# Patient Record
Sex: Female | Born: 1980 | Race: White | Hispanic: No | Marital: Married | Smoking: Current every day smoker
Health system: Southern US, Community
[De-identification: ages and names within clinical notes are randomized; demographics above are authoritative.]

## PROBLEM LIST (undated history)

## (undated) DIAGNOSIS — K635 Polyp of colon: Secondary | ICD-10-CM

## (undated) HISTORY — PX: APPENDECTOMY: SHX54

## (undated) HISTORY — PX: COLPOSCOPY: SHX161

## (undated) HISTORY — PX: VAGINAL HYSTERECTOMY: SUR661

## (undated) HISTORY — DX: Polyp of colon: K63.5

## (undated) HISTORY — PX: CERVICAL BIOPSY  W/ LOOP ELECTRODE EXCISION: SUR135

---

## 1999-05-08 ENCOUNTER — Encounter (INDEPENDENT_AMBULATORY_CARE_PROVIDER_SITE_OTHER): Payer: Self-pay | Admitting: Specialist

## 1999-05-08 ENCOUNTER — Ambulatory Visit (HOSPITAL_COMMUNITY): Admission: RE | Admit: 1999-05-08 | Discharge: 1999-05-08 | Payer: Self-pay | Admitting: Orthopedic Surgery

## 2000-09-08 ENCOUNTER — Other Ambulatory Visit: Admission: RE | Admit: 2000-09-08 | Discharge: 2000-09-08 | Payer: Self-pay | Admitting: Gynecology

## 2000-09-27 ENCOUNTER — Emergency Department (HOSPITAL_COMMUNITY): Admission: EM | Admit: 2000-09-27 | Discharge: 2000-09-27 | Payer: Self-pay | Admitting: Emergency Medicine

## 2000-10-05 ENCOUNTER — Other Ambulatory Visit: Admission: RE | Admit: 2000-10-05 | Discharge: 2000-10-05 | Payer: Self-pay | Admitting: Gynecology

## 2000-10-05 ENCOUNTER — Encounter (INDEPENDENT_AMBULATORY_CARE_PROVIDER_SITE_OTHER): Payer: Self-pay | Admitting: Specialist

## 2000-11-06 ENCOUNTER — Ambulatory Visit (HOSPITAL_COMMUNITY): Admission: RE | Admit: 2000-11-06 | Discharge: 2000-11-06 | Payer: Self-pay | Admitting: Gastroenterology

## 2000-11-06 ENCOUNTER — Encounter (INDEPENDENT_AMBULATORY_CARE_PROVIDER_SITE_OTHER): Payer: Self-pay | Admitting: *Deleted

## 2000-12-08 ENCOUNTER — Inpatient Hospital Stay (HOSPITAL_COMMUNITY): Admission: AD | Admit: 2000-12-08 | Discharge: 2000-12-08 | Payer: Self-pay | Admitting: Gynecology

## 2001-04-21 ENCOUNTER — Inpatient Hospital Stay (HOSPITAL_COMMUNITY): Admission: AD | Admit: 2001-04-21 | Discharge: 2001-04-24 | Payer: Self-pay | Admitting: Gynecology

## 2001-04-21 ENCOUNTER — Encounter (INDEPENDENT_AMBULATORY_CARE_PROVIDER_SITE_OTHER): Payer: Self-pay | Admitting: Specialist

## 2001-06-02 ENCOUNTER — Other Ambulatory Visit: Admission: RE | Admit: 2001-06-02 | Discharge: 2001-06-02 | Payer: Self-pay | Admitting: Urology

## 2002-04-19 ENCOUNTER — Other Ambulatory Visit: Admission: RE | Admit: 2002-04-19 | Discharge: 2002-04-19 | Payer: Self-pay | Admitting: Obstetrics and Gynecology

## 2002-06-10 ENCOUNTER — Ambulatory Visit (HOSPITAL_COMMUNITY): Admission: RE | Admit: 2002-06-10 | Discharge: 2002-06-10 | Payer: Self-pay | Admitting: Obstetrics and Gynecology

## 2002-06-10 ENCOUNTER — Encounter: Payer: Self-pay | Admitting: Obstetrics and Gynecology

## 2002-09-15 ENCOUNTER — Inpatient Hospital Stay (HOSPITAL_COMMUNITY): Admission: AD | Admit: 2002-09-15 | Discharge: 2002-09-15 | Payer: Self-pay | Admitting: Obstetrics and Gynecology

## 2002-09-21 ENCOUNTER — Encounter: Payer: Self-pay | Admitting: Obstetrics and Gynecology

## 2002-09-21 ENCOUNTER — Inpatient Hospital Stay (HOSPITAL_COMMUNITY): Admission: RE | Admit: 2002-09-21 | Discharge: 2002-09-21 | Payer: Self-pay | Admitting: Obstetrics and Gynecology

## 2002-09-26 ENCOUNTER — Encounter: Admission: RE | Admit: 2002-09-26 | Discharge: 2002-09-26 | Payer: Self-pay | Admitting: *Deleted

## 2002-09-26 ENCOUNTER — Encounter (HOSPITAL_COMMUNITY): Admission: RE | Admit: 2002-09-26 | Discharge: 2002-10-26 | Payer: Self-pay | Admitting: Obstetrics and Gynecology

## 2002-09-26 ENCOUNTER — Encounter: Payer: Self-pay | Admitting: Obstetrics and Gynecology

## 2002-09-26 ENCOUNTER — Encounter (INDEPENDENT_AMBULATORY_CARE_PROVIDER_SITE_OTHER): Payer: Self-pay | Admitting: Specialist

## 2002-09-26 ENCOUNTER — Inpatient Hospital Stay (HOSPITAL_COMMUNITY): Admission: AD | Admit: 2002-09-26 | Discharge: 2002-09-27 | Payer: Self-pay | Admitting: Obstetrics and Gynecology

## 2007-05-15 ENCOUNTER — Emergency Department (HOSPITAL_COMMUNITY): Admission: EM | Admit: 2007-05-15 | Discharge: 2007-05-15 | Payer: Self-pay | Admitting: Family Medicine

## 2010-11-15 NOTE — Discharge Summary (Signed)
NAME:  OMNI, DUNSWORTH                           ACCOUNT NO.:  [id number]   MEDICAL RECORD NO.:  [id number]                   PATIENT TYPE:  INP   LOCATION:  9322                                 FACILITY:  [facility]   PHYSICIAN:  Malachi Pro. Ambrose Mantle, M.D.              DATE OF BIRTH:  1980-10-13   DATE OF ADMISSION:  09/26/2002  DATE OF DISCHARGE:  09/27/2002                                 DISCHARGE SUMMARY   HISTORY OF PRESENT ILLNESS:  A 30 year old white married female para 1-0-0-1  gravida 2; EDC Nov 06, 2002; admitted with intrauterine fetal demise for  induction of labor.  Blood group and type O positive, negative antibody,  nonreactive serology, rubella immune.  Hepatitis B surface antigen negative,  HIV negative, GC and chlamydia negative, triple screen declined.  One-hour  Glucola 77.  Vaginal ultrasound on April 04, 2002:  Crown-rump length 2.44  cm, nine weeks one day, [facility] Nov 06, 2002.  Colposcopy on May 11, 2002  showed acetowhite area at 7 o'clock with punctation.  Follow-up ultrasound  June 11, 2002:  Average gestational age [redacted] weeks 0 days, [facility] Nov 04, 2002.  On approximately September 16, 2002 the patient came to maternity  admissions for possible rupture of membranes.  Tests were negative.  On  September 19, 2002 fundal height was 30 cm at 33 weeks.  Nonstress test was  reactive.  Sonogram showed symmetrical growth restriction, AFI was 9 cm,  Dopplers were normal.  The patient continued to feel fetal movement through  the night prior to admission.  On the day of admission at the scheduled  nonstress test there was no fetal heart rate.  Ultrasound showed a breech  presentation, no fetal heart rate.  The baby was vertex on September 21, 2002  and was breech on the day of admission.   PAST MEDICAL HISTORY:  1. Possible reaction to PENICILLIN.  2. Illnesses:  None except colon polyps in 2002 and urinary tract     infections.  3. Operations:  Cyst removed from her ankle in 2000,  cyst from her face in     1990.  4. Alcohol, tobacco, and drugs:  None.   FAMILY HISTORY:  Brother had high blood pressure.   OBSTETRICAL HISTORY:  In October 2002 she delivered a 5 pound 15 ounce  female.   HOSPITAL COURSE:  On admission her vital signs were normal.  The abdomen was  soft, fundal height 31 cm.  Ultrasound confirmed fetal demise.  Cervix was 2  cm, 30%, breech presentation.  The patient was begun on intravenous Pitocin.  By 6:18 p.m. the contractions were every two minutes.  The cervix was a  loose fingertip, 50%, bag of waters was bulging, presenting part was not  palpable.  By 9:25 p.m. the contractions were every two to three minutes,  cervix was 4 cm, 80-90%, the breech was at  a -2.  Artificial rupture of  membranes produced meconium-stained fluid.  Pitocin was at 30 milliunits a  minute.  The patient progressed rapidly and requested an epidural but she  did not have time to get it.  The baby presented as a double-footling breech  and she delivered spontaneously over an intact perineum.  The placenta  delivered and small fragments were left behind.  They were removed with ring  forceps.  The infant was female and looked normal physiologically.  Blood loss  was about 50-100 mL.  Postpartum the patient did well and wanted discharge  almost immediately after delivery, but she was kept for approximately eight  hours for observation.  Hemoglobin on admission 12.7; hematocrit 36.6; white  count 15,200; platelet count 217,000.  Follow-up hemoglobin 11.2; platelet  count 163,000; RPR nonreactive.   FINAL DIAGNOSIS:  Intrauterine pregnancy at 34 weeks with intrauterine fetal  demise delivered as a double-footling breech.   OPERATIONS:  Assisted delivery of a double-footling breech.   FINAL CONDITION:  Improved.   DISCHARGE INSTRUCTIONS:  1. No vaginal entrance.  2. Call with any fever above 100.4 degrees.  3. Call with any heavy vaginal bleeding.  4. Report any signs  of postpartum depression.  5. Return to the office in six weeks.   NOTE:  The couple has decided to have autopsy on the baby.  Initially, the  father-of-the-baby's reaction was he did not want his baby cut on, but they  did accept doing an autopsy in hopes of finding a cause for the demise.   DISPOSITION:  She is to return in six weeks, and a prescription for Percocet  5/325 #16 tablets one q.4-6h. is given at discharge.                                               Malachi Pro. Ambrose Mantle, M.D.    TFH/MEDQ  D:  09/27/2002  T:  09/27/2002  Job:  [id number]

## 2010-11-15 NOTE — H&P (Signed)
[facility]  Patient:    Diane Avery, Diane Avery Visit Number: [id number] MRN: [id number]          Service Type: OBS Location: 910B 9159 01 Attending Physician:  Tonye Royalty Dictated by:   Gaetano Hawthorne. Lily Peer, M.D. Admit Date:  04/21/2001                           History and Physical  CHIEF COMPLAINT:              Labor.  HISTORY:                      Patient is a 30 year old gravida 1, para 0 with last menstrual period on July 16, 2000, estimated date of confinement October 24th, currently [redacted] weeks gestation.  Patient presented to the [facility] early this morning complaining of contractions starting earlier in the day and intensifying in frequency.  She was found to be contracting every three to four minutes apart in Maternity Admissions with a reactive fetal heart rate tracing and her cervix was approximately 3-cm dilated, 80% effaced, -3 station with bulging membrane.  Patient had stable vital signs.  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  She and her husband were both tested for cystic fibrosis, which were negative.  She also had mucusy blood from her stool and had an evaluation by the gastroenterologist who did a sigmoidoscopy and removed a hyperplastic rectal polyp.  Also, she had an amniocentesis during the second trimester secondary to elevated alpha-fetoprotein and a questionable echogenic area noted in her bowel and amniocentesis was reported to be normal.  ALLERGIES:                    Patient is allergic to PENICILLIN.  PAST MEDICAL HISTORY:         She has a history of HPV, history of low-grade squamous intraepithelial lesion.  She had cysts removed from her face and ankle in 1989 and 2000,  respectively.  REVIEW OF SYSTEMS:            See [place] form.  PHYSICAL EXAMINATION:  GENERAL:                      Well-developed, well-nourished female.  HEENT:                        Unremarkable.  NECK:                         Supple.  Trachea midline.  No carotid bruit or thyromegaly.  LUNGS:                        Clear to auscultation without rhonchi or wheezes.  HEART:                        Regular rate and rhythm.  No murmurs or gallops.  BREASTS:                      Exam not done.  ABDOMEN:  Gravid uterus, vertex presentation by [employer].  PELVIC:                       Cervix is now 5 cm, 90%, -1 station, bulging membranes.  EXTREMITIES:                  DTRs 1+.  Negative clonus.  PRENATAL LABORATORY DATA:     O-positive blood type, negative antibody screen. VDRL was nonreactive.  Rubella immune.  Hepatitis B surface antigen and HIV were negative.  Pap smear was CIN-1.  GC and Chlamydia cultures were negative. Alpha-fetoprotein as described above and group B strep positive.  ASSESSMENT:                   [age]-year-old gravida 1, para 0, in labor, advanced cervical dilatation, positive group B streptococcus culture.  Patient was started on clindamycin for group beta streptococcus prophylaxis due to the fact that patient is allergic to penicillin.  Awaiting at the present time for an epidural and will proceed then with rupture of membranes and full internal monitorization.  Patients contractions have subsided and as soon as the epidural is on board, will begin with Pitocin augmentation.  The fetal heart rate tracing otherwise has been reassuring and her vital signs are stable, as per assessment above. Dictated by:   Gaetano Hawthorne Lily Peer, M.D. Attending Physician:  Tonye Royalty DD:  04/22/01 TD:  04/22/01 Job: [id number]

## 2010-11-15 NOTE — Procedures (Signed)
[place]  Patient:    Diane Avery, Diane Avery                        MRN: [id number] Proc. Date: 11/06/00 Adm. Date:  [id number] Disc. Date: [id number] Attending:  Charna Elizabeth CC:         Gaetano Hawthorne. Lily Peer, M.D.   Procedure Report  DATE OF BIRTH:  Feb 20, 1981.  PROCEDURE:  Flexible sigmoidoscopy with biopsies.  ENDOSCOPIST:  Anselmo Rod, M.D.  INSTRUMENT USED:  Pediatric Olympus colonoscope.  INDICATION FOR PROCEDURE:  Rectal bleeding and soft stool in a 30 year old white female who is [redacted] weeks pregnant.  Rule out distal proctitis versus left-sided colitis.  PREPROCEDURE PREPARATION:  Informed consent was procured from the patient. The patient was fasted for eight hours prior to the procedure and prepped with two Fleets enemas the morning of the procedure.  PREPROCEDURE PHYSICAL:  VITAL SIGNS:  The patient had stable vital signs.  NECK:  Supple.  CHEST:  Clear to auscultation.  S1, S2 regular.  ABDOMEN:  Soft with a gravid uterus.  No hepatosplenomegaly.  No masses palpable.  DESCRIPTION OF PROCEDURE:  The patient was placed in the left lateral decubitus position, and no sedation was used.  Once the patient was adequately positioned, the Olympus video pediatric colonoscope was advanced from the rectum to 80 cm without difficulty.  The patient had an excellent prep. Except for small internal hemorrhoids seen on retroflexion and a small rectal polyp, no other abnormalities were seen.  There was no evidence of colitis. The entire colonic mucosa appeared healthy up to 80 cm, with no evidence of erosions, ulcerations, or masses.  A small rectal polyp was seen in the rectum.  This was biopsied via cold biopsy forceps.  On retroflexion, there were small internal hemorrhoids appreciated.  There were whitish-appearing plaques over the hemorrhoids of unclear significance.  IMPRESSION: 1. Healthy-appearing colonic mucosa in the left colon  and rectum except for    small rectal polyp, biopsied for pathology. 2. Small internal hemorrhoids.  RECOMMENDATIONS: 1. Considering the fact the patient has had slight fever today, urinalysis,    CBC, with CMET will be done. 2. The patient has been advised to increase the fluid and fiber in her diet    and to hold off on any use of steroid suppositories because of her    pregnancy. 3. Fiber supplements have been discussed with her in great detail, and    brochures have been given for her education. 4. Follow-up is advised in the office within the next week.  She is to see    Dr. Lily Peer in his office today for further workup of her low-grade    fever. DD:  11/06/00 TD:  11/09/00 Job: [id number]

## 2010-11-15 NOTE — Discharge Summary (Signed)
[facility]  Patient:    Diane Avery, Diane Avery Visit Number: [id number] MRN: [id number]          Service Type: OBS Location: 910A 9118 01 Attending Physician:  Tonye Royalty Dictated by:   Antony Contras, [facility] Admit Date:  04/21/2001 Discharge Date: 04/24/2001                             Discharge Summary  DISCHARGE DIAGNOSES:          1. Intrauterine pregnancy at term.                               2. Spontaneous onset of labor.                               3. Recurrent deep variable decelerations.  PROCEDURES:                   Low forceps-assisted vaginal delivery, with delivery of viable infant over first-degree episiotomy.  Occult cord around infants leg with scant ______ .  HISTORY OF PRESENT ILLNESS:   The patient is a 30 year old primigravida with LMP of July 16, 2000, [facility] April 22, 2001.  Prenatal risk factors include history of HPV.  PRENATAL LABORATORY DATA:     Blood type 0 positive, antibody screen negative. RPR, HBsAg, HIV nonreactive.  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  She also had low-grade squamous intraepithelial lesions confirmed by colposcopy.  Also, she did have an amniocentesis secondary to elevated AFP and a questionable echogenic area on her valve, and amniocentesis was reported to be normal.  HOSPITAL COURSE:              The patient was admitted on April 22, 2001, with spontaneous onset of labor.  Cervix 5 cm, 90%, -1 station, with membranes bulging.  Labor progressed to complete dilatation.  The patient did experience deep variable decelerations.  Delivery was accomplished by Dr. Farrel Gobble per low forceps-assisted delivery.  The patient was delivered of an Apgars of 9 and 9, 5-pound 15-ounce female over a midline episiotomy, without lacerations. Occult cord was noted to be around the infants leg with ______.  Postpartum course:  The patient remained afebrile and was  able to be discharged in satisfactory condition on her second postoperative day.  CBC:  Hematocrit 29, hemoglobin 10.1, WBC 15.6, platelets 188.  FOLLOW-UP:                    In six weeks.  MEDICATIONS:                  Continue with prenatal vitamins and iron. Motrin or Tylox for pain. Dictated by:   Antony Contras, [facility] Attending Physician:  Tonye Royalty DD:  05/07/01 TD:  05/09/01 Job: [id number]

## 2017-12-29 ENCOUNTER — Ambulatory Visit: Payer: Managed Care, Other (non HMO) | Admitting: Gynecology

## 2017-12-29 ENCOUNTER — Encounter: Payer: Self-pay | Admitting: Gynecology

## 2017-12-29 VITALS — BP 116/76 | Ht 63.0 in | Wt 158.0 lb

## 2017-12-29 DIAGNOSIS — Z13220 Encounter for screening for lipoid disorders: Secondary | ICD-10-CM | POA: Diagnosis not present

## 2017-12-29 DIAGNOSIS — Z01419 Encounter for gynecological examination (general) (routine) without abnormal findings: Secondary | ICD-10-CM | POA: Diagnosis not present

## 2017-12-29 DIAGNOSIS — R102 Pelvic and perineal pain: Secondary | ICD-10-CM | POA: Diagnosis not present

## 2017-12-29 DIAGNOSIS — N951 Menopausal and female climacteric states: Secondary | ICD-10-CM | POA: Diagnosis not present

## 2017-12-29 NOTE — Progress Notes (Signed)
    Diane BraceMary S Avery 03-Jan-1981 [id number]        37 y.o.  G4P4 for annual gynecologic exam.  Has not been in the office for a number of years.  Most recently over the past 4 to 6 weeks has noticed both left and right lower quadrant/pelvic bloating and cramping pain.  Some nausea on and off.  No urinary symptoms such as frequency dysuria urgency low back pain fever or chills.  No constipation diarrhea.  Has had a colonoscopy last year.  Status post vaginal hysterectomy 2011 due to persistent abnormal Pap smears although review of her final pathology showed no residual dysplasia in the hysterectomy specimen.  No abnormal Pap smears since then.  Also notes some episodes of hot flushes in the middle of the night that has woken her up times several episodes.  No significant weight gain or loss.  No hair or skin changes.  Past medical history,surgical history, problem list, medications, allergies, family history and social history were all reviewed and documented as reviewed in the EPIC chart.  ROS:  Performed with pertinent positives and negatives included in the history, assessment and plan.   Additional significant findings : None   Exam: Kennon PortelaKim Gardner assistant Vitals:   12/29/17 1123  BP: 116/76  Weight: 158 lb (71.7 kg)  Height: 5\' 3"  (1.6 m)   Body mass index is 27.99 kg/m.  General appearance:  Normal affect, orientation and appearance. Skin: Grossly normal HEENT: Without gross lesions.  No cervical or supraclavicular adenopathy. Thyroid normal.  Lungs:  Clear without wheezing, rales or rhonchi Cardiac: RR, without RMG Abdominal:  Soft, nontender, without masses, guarding, rebound, organomegaly or hernia Breasts:  Examined lying and sitting without masses, retractions, discharge or axillary adenopathy. Pelvic:  Ext, BUS, Vagina: Normal.  Pap smear of vaginal cuff done  Adnexa: Without masses or tenderness    Anus and perineum: Normal   Rectovaginal: Normal sphincter tone without  palpated masses or tenderness.    Assessment/Plan:  [name] for annual gynecologic exam.   1. Lower pelvic discomfort over the past 4 to 6 weeks.  No localizing symptoms.  Status post vaginal hysterectomy in the past.  Also with menopausal symptoms of hot flushes at night.  No other symptoms such as weight gain loss hair or skin.  Has had a colonoscopy last year with colon polyps noted.  Will start with ultrasound for ovarian surveillance.  Urine analysis rule out bladder etiology.  Also [facility] due to menopausal symptoms.  Patient will follow-up for the ultrasound and then will go from there. 2. Breast health.  Breast exam normal today.  No strong family history of breast cancer.  Plan baseline mammogram at age 37. 3. History of abnormal Pap smears.  Had LEEP and ultimately hysterectomy in the past.  Final pathology from hysterectomy reviewed and showed no residual cervical dysplasia.  Last Pap smear reported 2014.  Pap smear done today.  Recommended continued vaginal surveillance for now with ongoing Pap smears at 3-year intervals. 4. Health maintenance.  Baseline CBC, comprehensive metabolic panel and lipid profile ordered with above hormonal blood work.  Follow-up for ultrasound as scheduled.   Dara Lordsimothy P Ayssa Bentivegna MD, 12:03 PM 12/29/2017

## 2017-12-29 NOTE — Patient Instructions (Signed)
Follow up for ultrasound as scheduled 

## 2017-12-29 NOTE — Addendum Note (Signed)
Addended by: Dayna BarkerGARDNER, KIMBERLY K on: 12/29/2017 12:17 PM   Modules accepted: Orders

## 2017-12-30 ENCOUNTER — Other Ambulatory Visit: Payer: Self-pay | Admitting: Gynecology

## 2017-12-30 DIAGNOSIS — E7800 Pure hypercholesterolemia, unspecified: Secondary | ICD-10-CM

## 2017-12-30 LAB — COMPREHENSIVE METABOLIC PANEL
AG Ratio: 2 (calc) (ref 1.0–2.5)
ALBUMIN MSPROF: 4.6 g/dL (ref 3.6–5.1)
ALKALINE PHOSPHATASE (APISO): 56 U/L (ref 33–115)
ALT: 11 U/L (ref 6–29)
AST: 15 U/L (ref 10–30)
BUN: 9 mg/dL (ref 7–25)
CHLORIDE: 103 mmol/L (ref 98–110)
CO2: 24 mmol/L (ref 20–32)
CREATININE: 0.76 mg/dL (ref 0.50–1.10)
Calcium: 9.7 mg/dL (ref 8.6–10.2)
GLOBULIN: 2.3 g/dL (ref 1.9–3.7)
GLUCOSE: 93 mg/dL (ref 65–99)
Potassium: 4 mmol/L (ref 3.5–5.3)
Sodium: 139 mmol/L (ref 135–146)
Total Bilirubin: 0.4 mg/dL (ref 0.2–1.2)
Total Protein: 6.9 g/dL (ref 6.1–8.1)

## 2017-12-30 LAB — CBC WITH DIFFERENTIAL/PLATELET
Basophils Absolute: 74 cells/uL (ref 0–200)
Basophils Relative: 0.7 %
EOS PCT: 1.1 %
Eosinophils Absolute: 116 cells/uL (ref 15–500)
HCT: 42.5 % (ref 35.0–45.0)
Hemoglobin: 14.6 g/dL (ref 11.7–15.5)
Lymphs Abs: 3423 cells/uL (ref 850–3900)
MCH: 32.3 pg (ref 27.0–33.0)
MCHC: 34.4 g/dL (ref 32.0–36.0)
MCV: 94 fL (ref 80.0–100.0)
MONOS PCT: 4.8 %
MPV: 10.8 fL (ref 7.5–12.5)
Neutro Abs: 6384 cells/uL (ref 1500–7800)
Neutrophils Relative %: 60.8 %
PLATELETS: 232 10*3/uL (ref 140–400)
RBC: 4.52 10*6/uL (ref 3.80–5.10)
RDW: 12.6 % (ref 11.0–15.0)
TOTAL LYMPHOCYTE: 32.6 %
WBC mixed population: 504 cells/uL (ref 200–950)
WBC: 10.5 10*3/uL (ref 3.8–10.8)

## 2017-12-30 LAB — FOLLICLE STIMULATING HORMONE: FSH: 4.7 m[IU]/mL

## 2017-12-30 LAB — THYROID PANEL WITH TSH
FREE THYROXINE INDEX: 2.7 (ref 1.4–3.8)
T3 Uptake: 28 % (ref 22–35)
T4 TOTAL: 9.7 ug/dL (ref 5.1–11.9)
TSH: 2.92 mIU/L

## 2017-12-30 LAB — URINALYSIS, COMPLETE W/RFL CULTURE
BILIRUBIN URINE: NEGATIVE
Bacteria, UA: NONE SEEN /HPF
Glucose, UA: NEGATIVE
Hgb urine dipstick: NEGATIVE
Hyaline Cast: NONE SEEN /LPF
Ketones, ur: NEGATIVE
Leukocyte Esterase: NEGATIVE
NITRITES URINE, INITIAL: NEGATIVE
PROTEIN: NEGATIVE
RBC / HPF: NONE SEEN /HPF (ref 0–2)
SQUAMOUS EPITHELIAL / LPF: NONE SEEN /HPF (ref <=5)
Specific Gravity, Urine: 1.004 (ref 1.001–1.03)
WBC, UA: NONE SEEN /HPF (ref 0–5)
pH: 7 (ref 5.0–8.0)

## 2017-12-30 LAB — LIPID PANEL
CHOL/HDL RATIO: 3.7 (calc) (ref <5.0)
CHOLESTEROL: 232 mg/dL — AB (ref <200)
HDL: 63 mg/dL (ref >50)
LDL CHOLESTEROL (CALC): 141 mg/dL — AB
NON-HDL CHOLESTEROL (CALC): 169 mg/dL — AB (ref <130)
Triglycerides: 151 mg/dL — ABNORMAL HIGH (ref <150)

## 2017-12-30 LAB — PAP IG W/ RFLX HPV ASCU

## 2017-12-30 LAB — NO CULTURE INDICATED

## 2018-01-11 ENCOUNTER — Ambulatory Visit: Payer: Managed Care, Other (non HMO) | Admitting: Gynecology

## 2018-01-11 ENCOUNTER — Ambulatory Visit (INDEPENDENT_AMBULATORY_CARE_PROVIDER_SITE_OTHER): Payer: Managed Care, Other (non HMO)

## 2018-01-11 ENCOUNTER — Encounter: Payer: Self-pay | Admitting: Gynecology

## 2018-01-11 VITALS — BP 116/74

## 2018-01-11 DIAGNOSIS — R102 Pelvic and perineal pain: Secondary | ICD-10-CM

## 2018-01-11 NOTE — Patient Instructions (Signed)
Try the over-the-counter probiotics to see if this does not help with your intestinal discomfort.  Follow-up with your gastroenterologist if the pain continues.  Follow-up in 1 year for annual exam

## 2018-01-11 NOTE — Progress Notes (Signed)
    Diane Avery 1980/09/10 [id number]        37 y.o.  G4P4 presents for ultrasound.  Patient has a history of lower abdominal bloating and cramping.  Some nausea on and off.  Ultrasound was ordered to rule out ovarian pathology.  Patient is status post hysterectomy in the past for dysplasia.  Past medical history,surgical history, problem list, medications, allergies, family history and social history were all reviewed and documented in the EPIC chart.  Directed ROS with pertinent positives and negatives documented in the history of present illness/assessment and plan.  Exam: Vitals:   01/11/18 1053  BP: 116/74   General appearance:  Normal  Ultrasound transvaginal status post hysterectomy noting vaginal cuff is normal.  Right and left ovaries are normal with physiologic follicular changes.  Cul-de-sac negative  Assessment/Plan:  37 y.o. G4P4 normal ultrasound status post hysterectomy.  We discussed that her pain is most likely GI.  I recommended a trial of probiotics to see if this would not help.  If her discomfort would continue I recommended she follow-up with her gastroenterologist and she agrees to do so.    Dara Lordsimothy P Martie Muhlbauer MD, 11:05 AM 01/11/2018

## 2018-01-25 ENCOUNTER — Ambulatory Visit: Payer: Self-pay | Admitting: Gynecology

## 2019-03-03 ENCOUNTER — Encounter: Payer: Self-pay | Admitting: Gynecology

## 2019-03-03 ENCOUNTER — Ambulatory Visit (INDEPENDENT_AMBULATORY_CARE_PROVIDER_SITE_OTHER): Payer: Managed Care, Other (non HMO) | Admitting: Gynecology

## 2019-03-03 ENCOUNTER — Other Ambulatory Visit: Payer: Self-pay

## 2019-03-03 VITALS — BP 118/76 | Ht 63.0 in | Wt 131.0 lb

## 2019-03-03 DIAGNOSIS — Z13220 Encounter for screening for lipoid disorders: Secondary | ICD-10-CM | POA: Diagnosis not present

## 2019-03-03 DIAGNOSIS — Z01419 Encounter for gynecological examination (general) (routine) without abnormal findings: Secondary | ICD-10-CM | POA: Diagnosis not present

## 2019-03-03 NOTE — Progress Notes (Signed)
    PRIYA MATSEN 1980/12/03 [id number]        38 y.o.  G4P4 for annual gynecologic exam.  Without gynecologic complaints  Past medical history,surgical history, problem list, medications, allergies, family history and social history were all reviewed and documented as reviewed in the EPIC chart.  ROS:  Performed with pertinent positives and negatives included in the history, assessment and plan.   Additional significant findings : None   Exam: Caryn Bee assistant Vitals:   03/03/19 0800  BP: 118/76  Weight: 131 lb (59.4 kg)  Height: 5\' 3"  (1.6 m)   Body mass index is 23.21 kg/m.  General appearance:  Normal affect, orientation and appearance. Skin: Grossly normal HEENT: Without gross lesions.  No cervical or supraclavicular adenopathy. Thyroid normal.  Lungs:  Clear without wheezing, rales or rhonchi Cardiac: RR, without RMG Abdominal:  Soft, nontender, without masses, guarding, rebound, organomegaly or hernia Breasts:  Examined lying and sitting without masses, retractions, discharge or axillary adenopathy. Pelvic:  Ext, BUS, Vagina: Normal  Adnexa: Without masses or tenderness    Anus and perineum: Normal   Rectovaginal: Normal sphincter tone without palpated masses or tenderness.    Assessment/Plan:  38 y.o. G39P4 female for annual gynecologic exam.  Status post TVH   1. Breast health.  Breast exam normal today.  Plan baseline mammography next year at age 28.  SBE monthly reviewed. 2. Pap smear 2019.  No Pap smear done today.  History of LEEP with ultimate hysterectomy.  Final pathology from the hysterectomy showed no dysplasia.  Pap smears have been normal since.  Plan continued Pap smear surveillance every 3 years. 3. Health maintenance.  CBC, CMP and lipid profile ordered.  Follow-up 1 year, sooner as needed.   Anastasio Auerbach MD, 8:25 AM 03/03/2019

## 2019-03-03 NOTE — Patient Instructions (Signed)
Follow-up in 1 year for annual exam, sooner if any issues. 

## 2019-03-04 LAB — COMPREHENSIVE METABOLIC PANEL
AG Ratio: 2.3 (calc) (ref 1.0–2.5)
ALT: 11 U/L (ref 6–29)
AST: 19 U/L (ref 10–30)
Albumin: 4.2 g/dL (ref 3.6–5.1)
Alkaline phosphatase (APISO): 40 U/L (ref 31–125)
BUN: 9 mg/dL (ref 7–25)
CO2: 29 mmol/L (ref 20–32)
Calcium: 9.6 mg/dL (ref 8.6–10.2)
Chloride: 103 mmol/L (ref 98–110)
Creat: 0.86 mg/dL (ref 0.50–1.10)
Globulin: 1.8 g/dL (calc) — ABNORMAL LOW (ref 1.9–3.7)
Glucose, Bld: 84 mg/dL (ref 65–99)
Potassium: 3.8 mmol/L (ref 3.5–5.3)
Sodium: 139 mmol/L (ref 135–146)
Total Bilirubin: 0.3 mg/dL (ref 0.2–1.2)
Total Protein: 6 g/dL — ABNORMAL LOW (ref 6.1–8.1)

## 2019-03-04 LAB — CBC WITH DIFFERENTIAL/PLATELET
Absolute Monocytes: 570 cells/uL (ref 200–950)
Basophils Absolute: 77 cells/uL (ref 0–200)
Basophils Relative: 0.9 %
Eosinophils Absolute: 298 cells/uL (ref 15–500)
Eosinophils Relative: 3.5 %
HCT: 39.3 % (ref 35.0–45.0)
Hemoglobin: 12.9 g/dL (ref 11.7–15.5)
Lymphs Abs: 3290 cells/uL (ref 850–3900)
MCH: 33.1 pg — ABNORMAL HIGH (ref 27.0–33.0)
MCHC: 32.8 g/dL (ref 32.0–36.0)
MCV: 100.8 fL — ABNORMAL HIGH (ref 80.0–100.0)
MPV: 11.4 fL (ref 7.5–12.5)
Monocytes Relative: 6.7 %
Neutro Abs: 4267 cells/uL (ref 1500–7800)
Neutrophils Relative %: 50.2 %
Platelets: 214 10*3/uL (ref 140–400)
RBC: 3.9 10*6/uL (ref 3.80–5.10)
RDW: 12.6 % (ref 11.0–15.0)
Total Lymphocyte: 38.7 %
WBC: 8.5 10*3/uL (ref 3.8–10.8)

## 2019-03-04 LAB — LIPID PANEL
Cholesterol: 152 mg/dL (ref <200)
HDL: 73 mg/dL (ref >=50)
LDL Cholesterol (Calc): 60 mg/dL (calc)
Non-HDL Cholesterol (Calc): 79 mg/dL (calc) (ref <130)
Total CHOL/HDL Ratio: 2.1 (calc) (ref <5.0)
Triglycerides: 108 mg/dL (ref <150)

## 2019-03-22 ENCOUNTER — Encounter: Payer: Self-pay | Admitting: Gynecology

## 2019-07-21 ENCOUNTER — Other Ambulatory Visit: Payer: Self-pay

## 2019-07-21 ENCOUNTER — Ambulatory Visit: Payer: Managed Care, Other (non HMO)

## 2019-07-21 ENCOUNTER — Ambulatory Visit
Admission: EM | Admit: 2019-07-21 | Discharge: 2019-07-21 | Disposition: A | Discharge status: Home / Self Care | Payer: Managed Care, Other (non HMO) | Attending: Emergency Medicine | Admitting: Emergency Medicine

## 2019-07-21 DIAGNOSIS — R6883 Chills (without fever): Secondary | ICD-10-CM

## 2019-07-21 DIAGNOSIS — Z20822 Contact with and (suspected) exposure to covid-19: Secondary | ICD-10-CM

## 2019-07-21 DIAGNOSIS — F17210 Nicotine dependence, cigarettes, uncomplicated: Secondary | ICD-10-CM

## 2019-07-21 DIAGNOSIS — B349 Viral infection, unspecified: Secondary | ICD-10-CM

## 2019-07-21 DIAGNOSIS — R0981 Nasal congestion: Secondary | ICD-10-CM

## 2019-07-21 DIAGNOSIS — J029 Acute pharyngitis, unspecified: Secondary | ICD-10-CM

## 2019-07-21 LAB — POCT RAPID STREP A (OFFICE): Rapid Strep A Screen: NEGATIVE

## 2019-07-21 NOTE — Discharge Instructions (Addendum)
Take Tylenol or ibuprofen as needed for fever or discomfort.  Take over-the-counter plain Mucinex as needed for congestion.  Rest and keep yourself hydrated.    You should self quarantine until your COVID test result is back.    Your rapid strep test is negative.  A throat culture is pending; we will call you if it is positive requiring treatment.

## 2019-07-21 NOTE — ED Provider Notes (Signed)
Diane Avery    CSN: [id number] Arrival date & time: 07/21/19  1121      History   Chief Complaint Chief Complaint  Patient presents with  . Sore Throat    HPI Diane Avery is a 39 y.o. female.   Patient presents with sore throat, sinus congestion, sinus pressure, chills x 2 days.  She denies fever, rash, shortness of breath, vomiting, diarrhea, or other symptoms.  No treatments attempted at home.  Patient had a PCR COVID test performed today at a testing site.  The history is provided by the patient.    Past Medical History:  Diagnosis Date  . Colon polyps     There are no problems to display for this patient.   Past Surgical History:  Procedure Laterality Date  . APPENDECTOMY    . CERVICAL BIOPSY  W/ LOOP ELECTRODE EXCISION    . COLPOSCOPY    . VAGINAL HYSTERECTOMY  age 28   bad paps    OB History    Gravida  4   Para  4   Term      Preterm      AB      Living  3     SAB      TAB      Ectopic      Multiple      Live Births  3            Home Medications    Prior to Admission medications   Medication Sig Start Date End Date Taking? Authorizing Provider  HYDROcodone-Acetaminophen 10-300 MG TABS Take 1 tablet by mouth every 6 (six) hours as needed.   Yes [provider]  methylphenidate 36 MG PO CR tablet Take 36 mg by mouth daily.   Yes [provider]  tizanidine (ZANAFLEX) 2 MG capsule Take 2 mg by mouth 3 (three) times daily as needed for muscle spasms.   Yes [provider]  rizatriptan (MAXALT) 5 MG tablet Take 5 mg by mouth as needed for migraine. May repeat in 2 hours if needed    [provider]    Family History Family History  Family history unknown: Yes    Social History Social History   Tobacco Use  . Smoking status: Current Every Day Smoker    Packs/day: 0.50  . Smokeless tobacco: Never Used  Substance Use Topics  . Alcohol use: Yes    Alcohol/week: 5.0 standard  drinks    Types: 5 Standard drinks or equivalent per week  . Drug use: Never     Allergies   Penicillins   Review of Systems Review of Systems  Constitutional: Positive for chills. Negative for fever.  HENT: Positive for congestion, sinus pressure and sore throat. Negative for ear pain.   Eyes: Negative for pain and visual disturbance.  Respiratory: Negative for cough and shortness of breath.   Cardiovascular: Negative for chest pain and palpitations.  Gastrointestinal: Negative for abdominal pain, diarrhea, nausea and vomiting.  Genitourinary: Negative for dysuria and hematuria.  Musculoskeletal: Negative for arthralgias and back pain.  Skin: Negative for color change and rash.  Neurological: Negative for seizures and syncope.  All other systems reviewed and are negative.    Physical Exam Triage Vital Signs ED Triage Vitals  Enc Vitals Group     BP      Pulse      Resp      Temp      Temp src  SpO2      Weight      Height      Head Circumference      Peak Flow      Pain Score      Pain Loc      Pain Edu?      Excl. in [place]?    No data found.  Updated Vital Signs BP 120/77 (BP Location: Left Arm)   Pulse 76   Temp 99 F (37.2 C) (Oral)   Resp 14   Ht 5\' 2"  (1.575 m)   Wt 130 lb (59 kg)   SpO2 97%   BMI 23.78 kg/m   Visual Acuity Right Eye Distance:   Left Eye Distance:   Bilateral Distance:    Right Eye Near:   Left Eye Near:    Bilateral Near:     Physical Exam Vitals and nursing note reviewed.  Constitutional:      General: She is not in acute distress.    Appearance: She is well-developed.  HENT:     Head: Normocephalic and atraumatic.     Right Ear: Tympanic membrane normal.     Left Ear: Tympanic membrane normal.     Nose: Nose normal.     Mouth/Throat:     Mouth: Mucous membranes are moist.     Pharynx: Oropharynx is clear.  Eyes:     Conjunctiva/sclera: Conjunctivae normal.  Cardiovascular:     Rate and Rhythm: Normal rate  and regular rhythm.     Heart sounds: No murmur.  Pulmonary:     Effort: Pulmonary effort is normal. No respiratory distress.     Breath sounds: Normal breath sounds. No wheezing or rhonchi.  Abdominal:     General: Bowel sounds are normal.     Palpations: Abdomen is soft.     Tenderness: There is no abdominal tenderness. There is no guarding or rebound.  Musculoskeletal:     Cervical back: Neck supple.  Skin:    General: Skin is warm and dry.     Findings: No rash.  Neurological:     General: No focal deficit present.     Mental Status: She is alert and oriented to person, place, and time.  Psychiatric:        Mood and Affect: Mood normal.        Behavior: Behavior normal.      UC Treatments / Results  Labs (all labs ordered are listed, but only abnormal results are displayed) Labs Reviewed  CULTURE, GROUP A STREP [facility])  POCT RAPID STREP A (OFFICE)    EKG   Radiology No results found.  Procedures Procedures (including critical care time)  Medications Ordered in UC Medications - No data to display  Initial Impression / Assessment and Plan / UC Course  I have reviewed the triage vital signs and the nursing notes.  Pertinent labs & imaging results that were available during my care of the patient were reviewed by me and considered in my medical decision making (see chart for details).    Viral illness.  Rapid strep negative; throat culture pending.  Instructed patient to quarantine until her COVID test results are back.  Discussed symptomatic treatment with Tylenol or ibuprofen, Mucinex, rest, hydration.  Instructed patient to follow-up with her primary care provider if her symptoms are not improving.  Patient agrees to this plan of care.     Final Clinical Impressions(s) / UC Diagnoses   Final diagnoses:  Viral illness  Discharge Instructions     Take Tylenol or ibuprofen as needed for fever or discomfort.  Take over-the-counter plain Mucinex as needed  for congestion.  Rest and keep yourself hydrated.    You should self quarantine until your COVID test result is back.    Your rapid strep test is negative.  A throat culture is pending; we will call you if it is positive requiring treatment.       ED Prescriptions    None     PDMP not reviewed this encounter.   Mickie Bail, NP 07/21/19 1223

## 2019-07-21 NOTE — ED Triage Notes (Signed)
Patient complains of sore throat x Tuesday night. Patient also complains of sinus pain and pressure. States that she had a covid test this morning but results are pending.

## 2019-07-22 LAB — NOVEL CORONAVIRUS, NAA: SARS-CoV-2, NAA: NOT DETECTED

## 2019-07-24 LAB — CULTURE, GROUP A STREP (THRC)

## 2021-01-10 DIAGNOSIS — Z1211 Encounter for screening for malignant neoplasm of colon: Secondary | ICD-10-CM | POA: Diagnosis not present

## 2021-01-10 DIAGNOSIS — Z86010 Personal history of colonic polyps: Secondary | ICD-10-CM | POA: Diagnosis not present

## 2021-01-10 DIAGNOSIS — K921 Melena: Secondary | ICD-10-CM | POA: Diagnosis not present

## 2021-01-10 DIAGNOSIS — K625 Hemorrhage of anus and rectum: Secondary | ICD-10-CM | POA: Diagnosis not present

## 2021-01-14 DIAGNOSIS — K921 Melena: Secondary | ICD-10-CM | POA: Diagnosis not present

## 2021-01-14 DIAGNOSIS — K449 Diaphragmatic hernia without obstruction or gangrene: Secondary | ICD-10-CM | POA: Diagnosis not present

## 2021-01-14 DIAGNOSIS — Z1211 Encounter for screening for malignant neoplasm of colon: Secondary | ICD-10-CM | POA: Diagnosis not present

## 2021-05-13 DIAGNOSIS — I639 Cerebral infarction, unspecified: Secondary | ICD-10-CM | POA: Diagnosis not present

## 2021-05-13 DIAGNOSIS — R202 Paresthesia of skin: Secondary | ICD-10-CM | POA: Diagnosis not present

## 2021-05-13 DIAGNOSIS — R29818 Other symptoms and signs involving the nervous system: Secondary | ICD-10-CM | POA: Diagnosis not present

## 2021-05-13 DIAGNOSIS — R200 Anesthesia of skin: Secondary | ICD-10-CM | POA: Diagnosis not present

## 2021-05-13 DIAGNOSIS — Z880 Allergy status to penicillin: Secondary | ICD-10-CM | POA: Diagnosis not present

## 2021-05-13 DIAGNOSIS — M79602 Pain in left arm: Secondary | ICD-10-CM | POA: Diagnosis not present

## 2021-05-13 DIAGNOSIS — R4701 Aphasia: Secondary | ICD-10-CM | POA: Diagnosis not present

## 2021-05-13 DIAGNOSIS — R0602 Shortness of breath: Secondary | ICD-10-CM | POA: Diagnosis not present

## 2021-05-13 DIAGNOSIS — F32A Depression, unspecified: Secondary | ICD-10-CM | POA: Diagnosis not present

## 2021-05-13 DIAGNOSIS — R0789 Other chest pain: Secondary | ICD-10-CM | POA: Diagnosis not present

## 2021-05-13 DIAGNOSIS — F418 Other specified anxiety disorders: Secondary | ICD-10-CM | POA: Diagnosis not present

## 2021-05-13 DIAGNOSIS — F419 Anxiety disorder, unspecified: Secondary | ICD-10-CM | POA: Diagnosis not present

## 2021-05-13 DIAGNOSIS — F17210 Nicotine dependence, cigarettes, uncomplicated: Secondary | ICD-10-CM | POA: Diagnosis not present

## 2021-05-13 DIAGNOSIS — R002 Palpitations: Secondary | ICD-10-CM | POA: Diagnosis not present

## 2021-05-13 DIAGNOSIS — F909 Attention-deficit hyperactivity disorder, unspecified type: Secondary | ICD-10-CM | POA: Diagnosis not present

## 2021-06-13 DIAGNOSIS — G43909 Migraine, unspecified, not intractable, without status migrainosus: Secondary | ICD-10-CM | POA: Diagnosis not present

## 2021-06-13 DIAGNOSIS — Z8742 Personal history of other diseases of the female genital tract: Secondary | ICD-10-CM | POA: Diagnosis not present

## 2021-06-13 DIAGNOSIS — Z720 Tobacco use: Secondary | ICD-10-CM | POA: Diagnosis not present

## 2021-07-11 DIAGNOSIS — F902 Attention-deficit hyperactivity disorder, combined type: Secondary | ICD-10-CM | POA: Diagnosis not present

## 2021-07-11 DIAGNOSIS — Z79899 Other long term (current) drug therapy: Secondary | ICD-10-CM | POA: Diagnosis not present

## 2021-08-22 DIAGNOSIS — Z13220 Encounter for screening for lipoid disorders: Secondary | ICD-10-CM | POA: Diagnosis not present

## 2021-08-22 DIAGNOSIS — G43909 Migraine, unspecified, not intractable, without status migrainosus: Secondary | ICD-10-CM | POA: Diagnosis not present

## 2021-08-22 DIAGNOSIS — F411 Generalized anxiety disorder: Secondary | ICD-10-CM | POA: Diagnosis not present

## 2021-08-22 DIAGNOSIS — Z0000 Encounter for general adult medical examination without abnormal findings: Secondary | ICD-10-CM | POA: Diagnosis not present

## 2021-08-22 DIAGNOSIS — Z23 Encounter for immunization: Secondary | ICD-10-CM | POA: Diagnosis not present

## 2021-08-22 DIAGNOSIS — Z1159 Encounter for screening for other viral diseases: Secondary | ICD-10-CM | POA: Diagnosis not present

## 2021-09-03 ENCOUNTER — Other Ambulatory Visit: Payer: Self-pay | Admitting: Family Medicine

## 2021-09-03 DIAGNOSIS — Z1231 Encounter for screening mammogram for malignant neoplasm of breast: Secondary | ICD-10-CM

## 2021-10-23 ENCOUNTER — Ambulatory Visit (INDEPENDENT_AMBULATORY_CARE_PROVIDER_SITE_OTHER): Payer: BC Managed Care – PPO | Admitting: Radiology

## 2021-10-23 ENCOUNTER — Ambulatory Visit
Admission: RE | Admit: 2021-10-23 | Discharge: 2021-10-23 | Disposition: A | Discharge status: Home / Self Care | Payer: BC Managed Care – PPO | Source: Ambulatory Visit | Attending: Family Medicine | Admitting: Family Medicine

## 2021-10-23 ENCOUNTER — Encounter: Payer: Self-pay | Admitting: Radiology

## 2021-10-23 ENCOUNTER — Other Ambulatory Visit (HOSPITAL_COMMUNITY)
Admission: RE | Admit: 2021-10-23 | Discharge: 2021-10-23 | Disposition: A | Discharge status: Home / Self Care | Payer: BC Managed Care – PPO | Source: Ambulatory Visit | Attending: Radiology | Admitting: Radiology

## 2021-10-23 VITALS — BP 124/80 | Ht 63.0 in | Wt 134.0 lb

## 2021-10-23 DIAGNOSIS — Z1231 Encounter for screening mammogram for malignant neoplasm of breast: Secondary | ICD-10-CM

## 2021-10-23 DIAGNOSIS — R232 Flushing: Secondary | ICD-10-CM

## 2021-10-23 DIAGNOSIS — Z9889 Other specified postprocedural states: Secondary | ICD-10-CM

## 2021-10-23 DIAGNOSIS — T732XXA Exhaustion due to exposure, initial encounter: Secondary | ICD-10-CM | POA: Insufficient documentation

## 2021-10-23 DIAGNOSIS — Z01419 Encounter for gynecological examination (general) (routine) without abnormal findings: Secondary | ICD-10-CM | POA: Insufficient documentation

## 2021-10-23 NOTE — Progress Notes (Signed)
? ?  Diane Avery December 24, 1980 [id number] ? ? ?History:  41 y.o. G4P4 presents for annual exam. S/p hyst. Hx of Leep and abnormal paps, last pap negative 2019. Has mammogram scheduled this afternoon. She c/o hot flashes, fatigue and changes in her skin texture, very dry over the past few months. She also reports her hair texture has changed and her hair is suddenly curly. Sexually active with husband x 20 years, no concerns with intercourse. ? ? ?Past medical history, past surgical history, family history and social history were all reviewed and documented in the EPIC chart. ? ?ROS:  A ROS was performed and pertinent positives and negatives are included. ? ?Exam: ? ?Vitals:  ? 10/23/21 1125  ?BP: 124/80  ?Weight: 134 lb (60.8 kg)  ?Height: 5\' 3"  (1.6 m)  ? ?Body mass index is 23.74 kg/m?. ? ?General appearance:  Normal ?Thyroid:  Symmetrical, normal in size, without palpable masses or nodularity. ?Respiratory ? Auscultation:  Clear without wheezing or rhonchi ?Cardiovascular ? Auscultation:  Regular rate, without rubs, murmurs or gallops ? Edema/varicosities:  Not grossly evident ?Abdominal ? Soft,nontender, without masses, guarding or rebound. ? Liver/spleen:  No organomegaly noted ? Hernia:  None appreciated ? Skin ? Inspection:  Grossly normal ?Breasts: Examined lying and sitting.  ? Right: Without masses, retractions, nipple discharge or axillary adenopathy. ? ? Left: Without masses, retractions, nipple discharge or axillary adenopathy. ?Genitourinary  ? Inguinal/mons:  Normal without inguinal adenopathy ? External genitalia:  Normal appearing vulva with no masses, tenderness, or lesions ? BUS/Urethra/Skene's glands:  Normal ? Vagina:  Normal appearing with normal color and discharge, no lesions.  ? Cervix:  absent ? Uterus:  absent ? Adnexa/parametria:   ?  Rt: Normal in size, without masses or tenderness. ?  Lt: Normal in size, without masses or tenderness. ? Anus and perineum: Normal ? Digital rectal exam: Normal  sphincter tone without palpated masses or tenderness ? ?Patient informed chaperone available to be present for breast and pelvic exam. Patient has requested no chaperone to be present. Patient has been advised what will be completed during breast and pelvic exam.  ? ?Assessment/Plan:   ?1. Well woman exam with routine gynecological exam ?Mammo scheduled ?Colonoscopy up to date- hx of polyps ?Smoking cessation ? ?2. History of loop electrical excision procedure (LEEP) ? ?- Cytology - PAP( [place]) ? ?3. Hot flashes ? ?- FSH ?- Estradiol ? ?4. Fatigue due to exposure, initial encounter ? ?- CBC ?- Thyroid Panel With TSH  ? ? ?Discussed SBE, colonoscopy and DEXA screening as appropriate. Encouraged 126mins/week of cardiovascular and weight bearing exercise minimum. Recommend the use of seatbelts and sunscreen consistently.  ? ?Return in 1 year for annual or sooner prn. ? 45m WHNP-BC 11:49 AM 10/23/2021  ?

## 2021-10-24 LAB — THYROID PANEL WITH TSH
Free Thyroxine Index: 2.7 (ref 1.4–3.8)
T3 Uptake: 29 % (ref 22–35)
T4, Total: 9.2 ug/dL (ref 5.1–11.9)
TSH: 1.12 mIU/L

## 2021-10-24 LAB — CBC
HCT: 43.2 % (ref 35.0–45.0)
Hemoglobin: 14.7 g/dL (ref 11.7–15.5)
MCH: 33.2 pg — ABNORMAL HIGH (ref 27.0–33.0)
MCHC: 34 g/dL (ref 32.0–36.0)
MCV: 97.5 fL (ref 80.0–100.0)
MPV: 11.7 fL (ref 7.5–12.5)
Platelets: 254 10*3/uL (ref 140–400)
RBC: 4.43 10*6/uL (ref 3.80–5.10)
RDW: 12.2 % (ref 11.0–15.0)
WBC: 9 10*3/uL (ref 3.8–10.8)

## 2021-10-24 LAB — ESTRADIOL: Estradiol: 103 pg/mL

## 2021-10-24 LAB — FOLLICLE STIMULATING HORMONE: FSH: 10.4 m[IU]/mL

## 2021-10-25 LAB — CYTOLOGY - PAP
Comment: NEGATIVE
Diagnosis: NEGATIVE
High risk HPV: NEGATIVE

## 2021-11-20 DIAGNOSIS — F902 Attention-deficit hyperactivity disorder, combined type: Secondary | ICD-10-CM | POA: Diagnosis not present

## 2022-11-14 IMAGING — MG MM DIGITAL SCREENING BILAT W/ TOMO AND CAD
6 of 10 series · 6 of 30 positions shown · non-contrast
Comparison: None.

CLINICAL DATA: Screening.

EXAM:
DIGITAL SCREENING BILATERAL MAMMOGRAM WITH TOMOSYNTHESIS AND CAD
TECHNIQUE: Bilateral screening digital craniocaudal and mediolateral oblique
mammograms were obtained. Bilateral screening digital breast
tomosynthesis was performed. The images were evaluated with
computer-aided detection.

[L CC synth-2D]
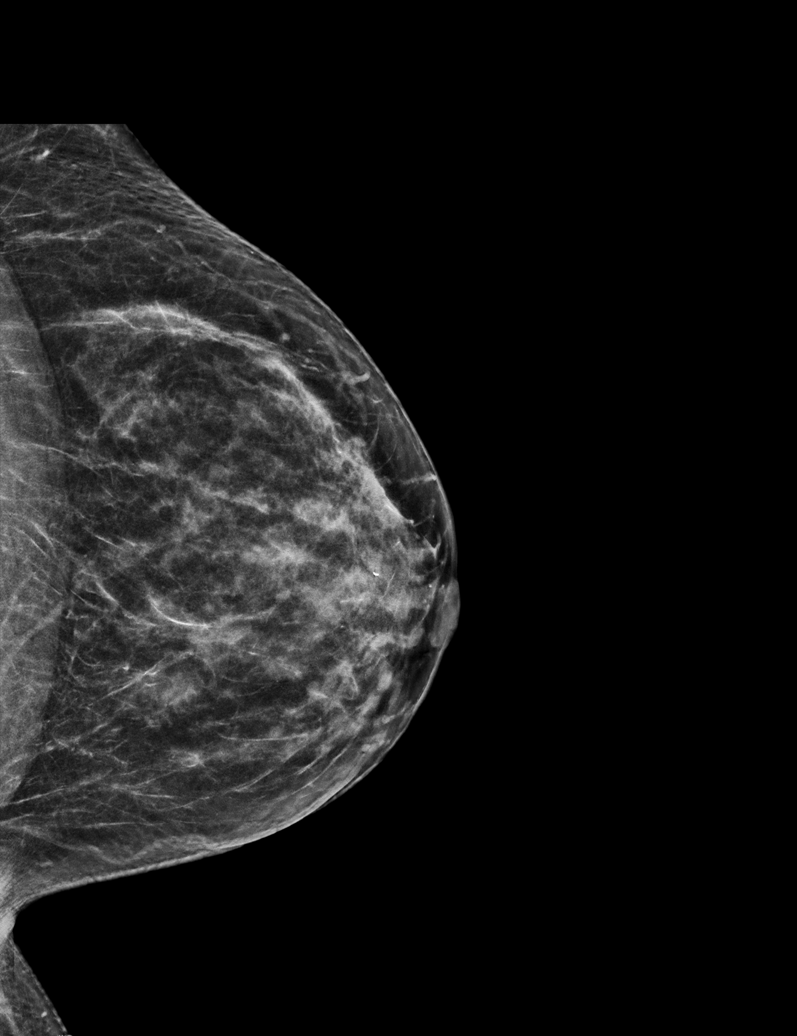

[R CC synth-2D (1 of 2)]
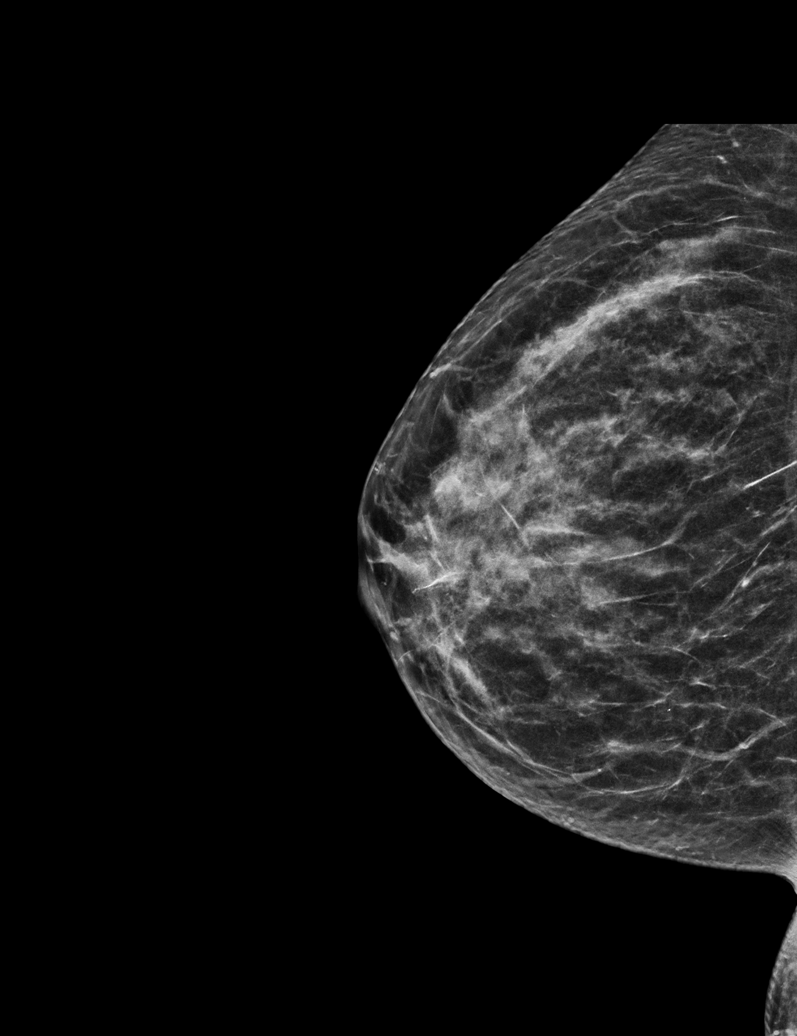

[R MLO synth-2D]
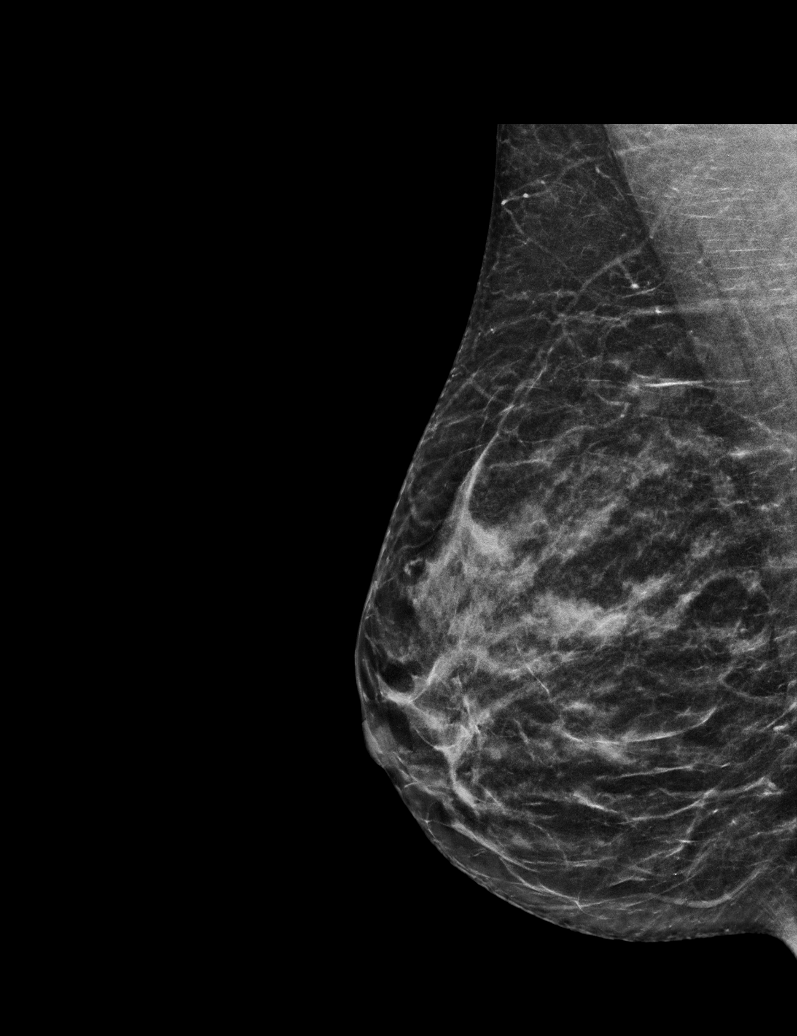

[L MLO synth-2D]
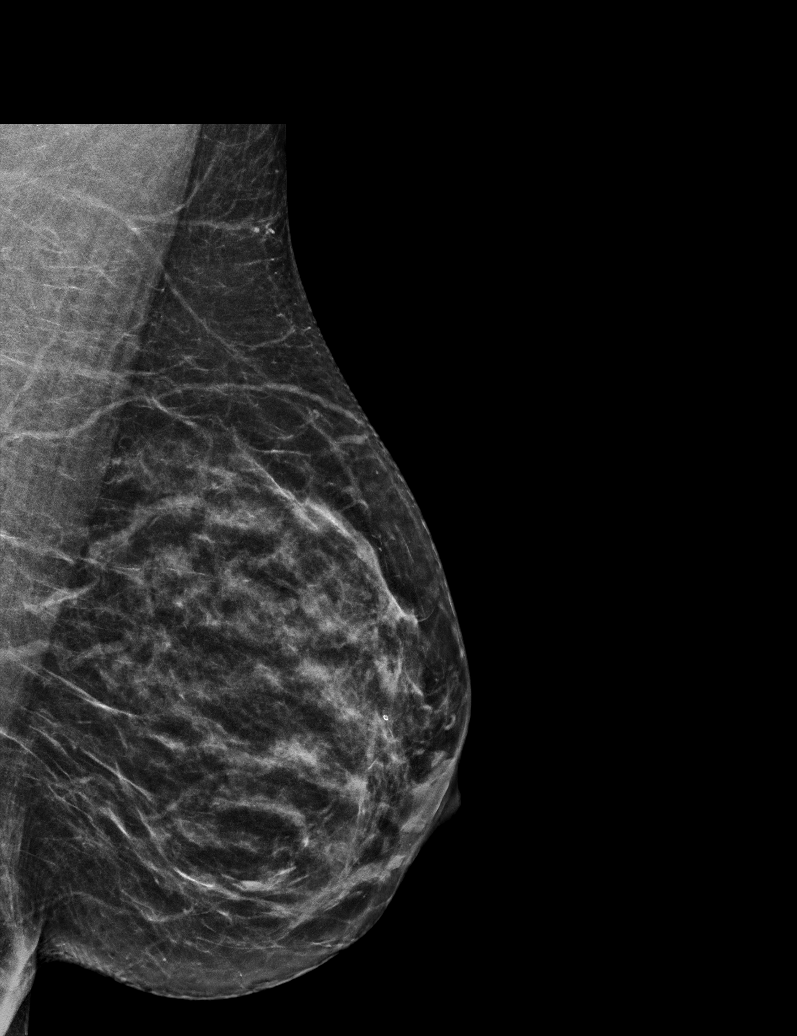

[R CC synth-2D (2 of 2)]
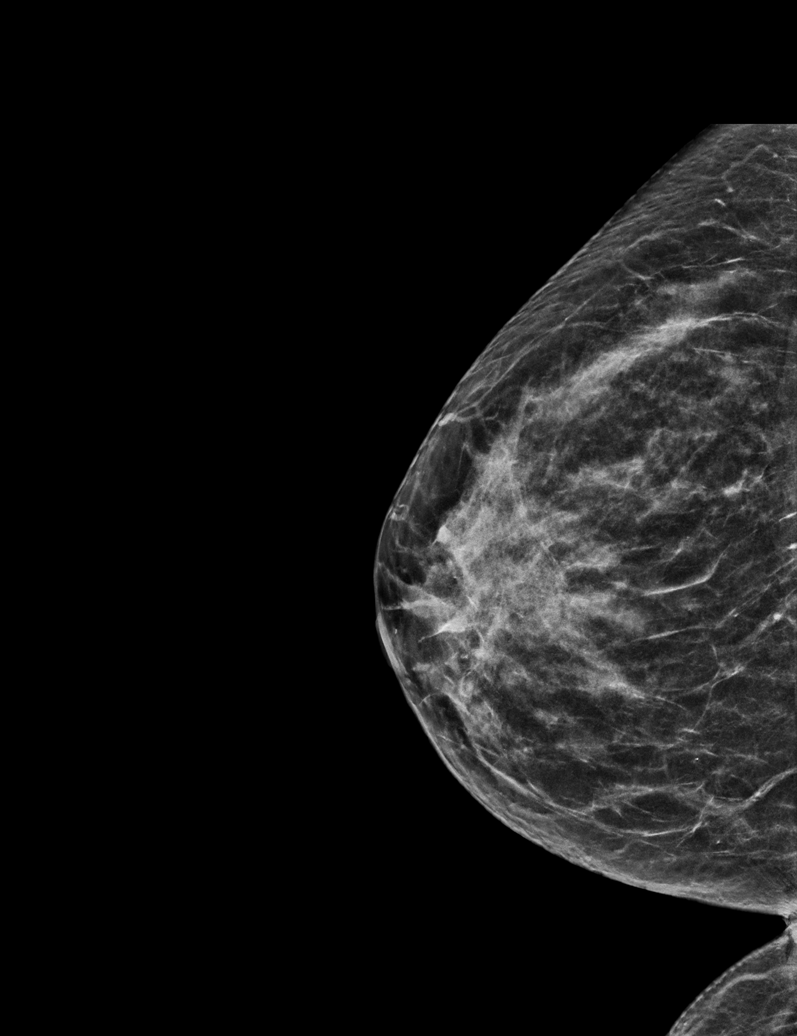

[R CC tomo · tomo slice 26/51.0]
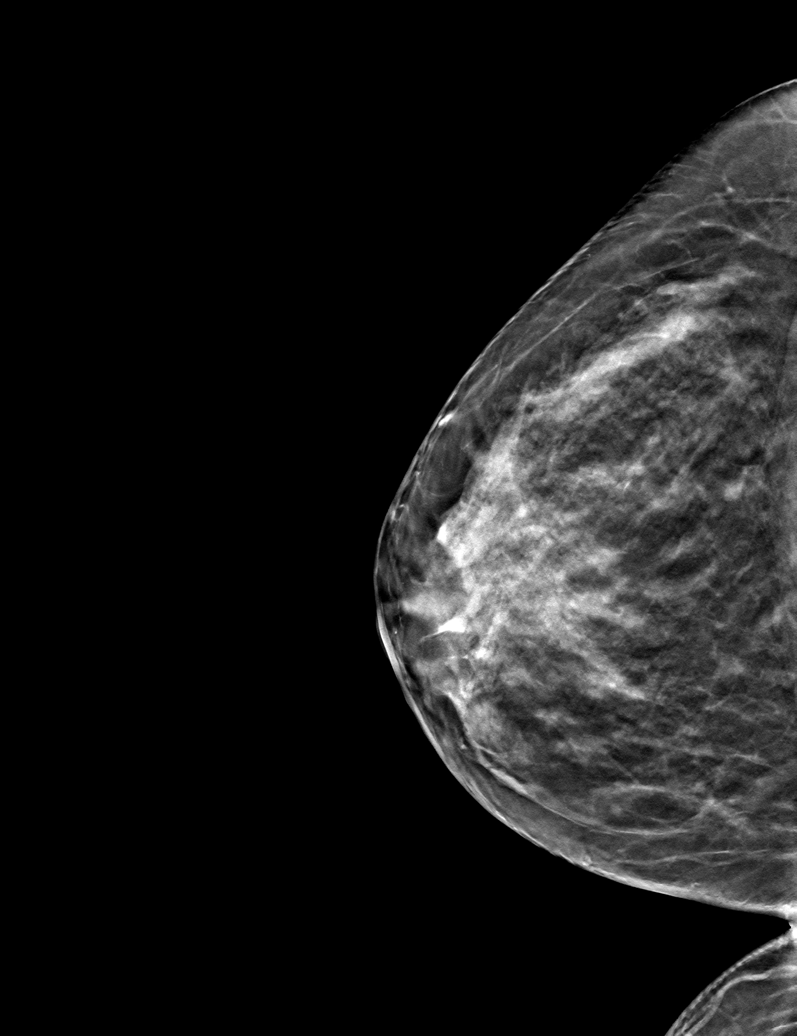

[6 of 30 positions shown; findings below may reference images not displayed]

ACR Breast Density Category c: The breast tissue is heterogeneously
dense, which may obscure small masses
FINDINGS: There are no findings suspicious for malignancy.
IMPRESSION: No mammographic evidence of malignancy. A result letter of this
screening mammogram will be mailed directly to the patient.

RECOMMENDATION:
Screening mammogram in one year. (Code:C8-T-HNK)

BI-RADS CATEGORY  1: Negative.
